# Patient Record
Sex: Female | Born: 1970 | Race: Black or African American | Hispanic: No | Marital: Married | Smoking: Never smoker
Health system: Southern US, Community
[De-identification: ages and names within clinical notes are randomized; demographics above are authoritative.]

---

## 2003-06-09 ENCOUNTER — Other Ambulatory Visit: Admission: RE | Admit: 2003-06-09 | Discharge: 2003-06-09 | Payer: Self-pay | Admitting: Gynecology

## 2005-04-30 ENCOUNTER — Other Ambulatory Visit: Admission: RE | Admit: 2005-04-30 | Discharge: 2005-04-30 | Payer: Self-pay | Admitting: Family Medicine

## 2006-07-28 ENCOUNTER — Encounter: Admission: RE | Admit: 2006-07-28 | Discharge: 2006-07-28 | Payer: Self-pay | Admitting: Family Medicine

## 2008-05-13 ENCOUNTER — Other Ambulatory Visit: Admission: RE | Admit: 2008-05-13 | Discharge: 2008-05-13 | Payer: Self-pay | Admitting: Family Medicine

## 2009-10-16 ENCOUNTER — Ambulatory Visit: Payer: Self-pay | Admitting: Gynecology

## 2009-10-16 ENCOUNTER — Other Ambulatory Visit: Admission: RE | Admit: 2009-10-16 | Discharge: 2009-10-16 | Payer: Self-pay | Admitting: Gynecology

## 2009-10-19 ENCOUNTER — Ambulatory Visit: Payer: Self-pay | Admitting: Gynecology

## 2010-01-15 ENCOUNTER — Ambulatory Visit (HOSPITAL_COMMUNITY): Admission: RE | Admit: 2010-01-15 | Discharge: 2010-01-15 | Payer: Self-pay | Admitting: Obstetrics and Gynecology

## 2010-02-06 ENCOUNTER — Ambulatory Visit (HOSPITAL_COMMUNITY): Admission: RE | Admit: 2010-02-06 | Discharge: 2010-02-06 | Payer: Self-pay | Admitting: Obstetrics and Gynecology

## 2010-02-27 ENCOUNTER — Ambulatory Visit (HOSPITAL_COMMUNITY): Admission: RE | Admit: 2010-02-27 | Discharge: 2010-02-27 | Payer: Self-pay | Admitting: Obstetrics and Gynecology

## 2010-05-27 ENCOUNTER — Inpatient Hospital Stay (HOSPITAL_COMMUNITY): Admission: AD | Admit: 2010-05-27 | Discharge: 2010-05-30 | Payer: Self-pay | Admitting: Obstetrics and Gynecology

## 2010-05-30 ENCOUNTER — Encounter: Admission: RE | Admit: 2010-05-30 | Discharge: 2010-06-29 | Payer: Self-pay | Admitting: Obstetrics and Gynecology

## 2010-06-30 ENCOUNTER — Encounter: Admission: RE | Admit: 2010-06-30 | Discharge: 2010-07-04 | Payer: Self-pay | Admitting: Obstetrics and Gynecology

## 2010-07-31 ENCOUNTER — Encounter: Admission: RE | Admit: 2010-07-31 | Discharge: 2010-08-30 | Payer: Self-pay | Admitting: Obstetrics and Gynecology

## 2010-08-31 ENCOUNTER — Encounter
Admission: RE | Admit: 2010-08-31 | Discharge: 2010-09-30 | Payer: Self-pay | Source: Home / Self Care | Attending: Obstetrics and Gynecology | Admitting: Obstetrics and Gynecology

## 2010-10-01 ENCOUNTER — Encounter
Admission: RE | Admit: 2010-10-01 | Discharge: 2010-10-31 | Payer: Self-pay | Source: Home / Self Care | Attending: Obstetrics and Gynecology | Admitting: Obstetrics and Gynecology

## 2010-11-01 ENCOUNTER — Encounter
Admission: RE | Admit: 2010-11-01 | Discharge: 2010-11-13 | Payer: Self-pay | Source: Home / Self Care | Attending: Obstetrics and Gynecology | Admitting: Obstetrics and Gynecology

## 2010-11-03 ENCOUNTER — Encounter: Payer: Self-pay | Admitting: Family Medicine

## 2010-11-04 ENCOUNTER — Encounter: Payer: Self-pay | Admitting: Obstetrics

## 2010-12-28 LAB — CBC
HCT: 29 % — ABNORMAL LOW (ref 36.0–46.0)
Hemoglobin: 10.1 g/dL — ABNORMAL LOW (ref 12.0–15.0)
MCH: 34.2 pg — ABNORMAL HIGH (ref 26.0–34.0)
MCH: 34 pg (ref 26.0–34.0)
MCHC: 34.7 g/dL (ref 30.0–36.0)
MCV: 98.3 fL (ref 78.0–100.0)
Platelets: 230 10*3/uL (ref 150–400)
RDW: 14 % (ref 11.5–15.5)

## 2011-07-25 IMAGING — US US OB DETAIL+14 WK
1 series · 18 of 28 positions shown · non-contrast
Comparison: none

OBSTETRICAL ULTRASOUND:
 This ultrasound was performed in The [HOSPITAL], and the [facility] report will be stored to [REDACTED] PACS.  This report is also available in [HOSPITAL]?s accessANYware.

[Series 1: us ob detail+14 wk · 18 of 114 slices shown]
[im 1/114]
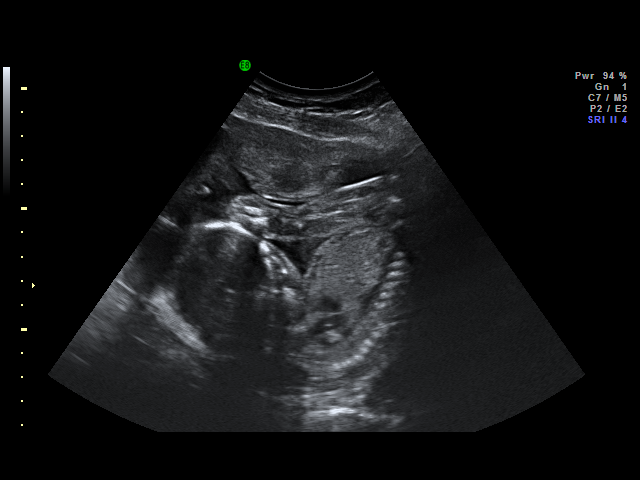
[im 9/114]
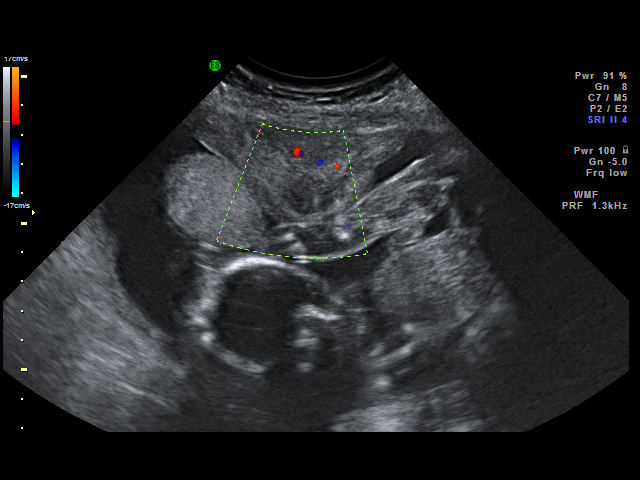
[im 13/114]
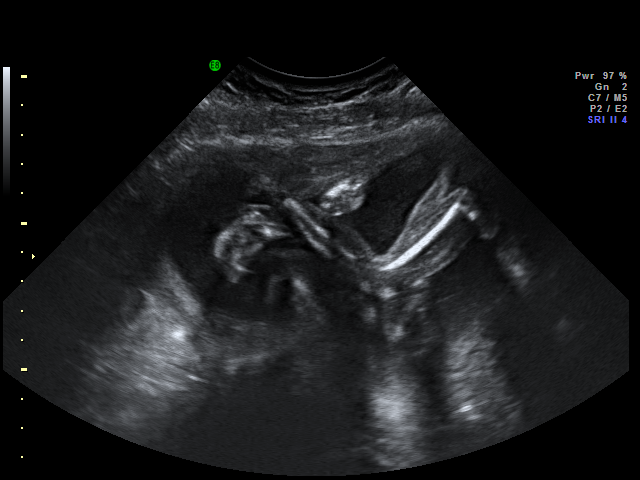
[im 21/114]
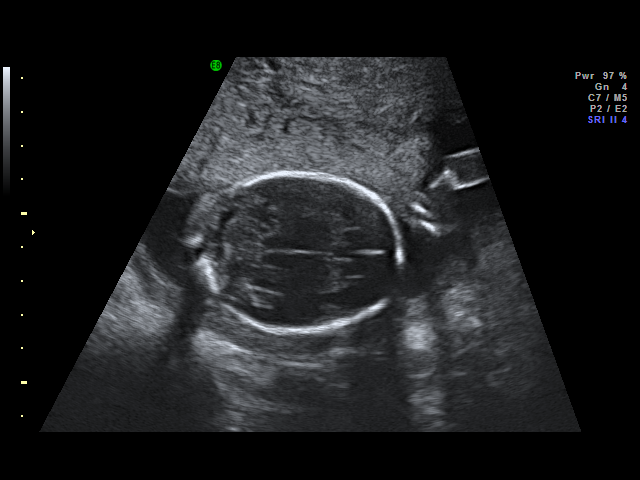
[im 30/114]
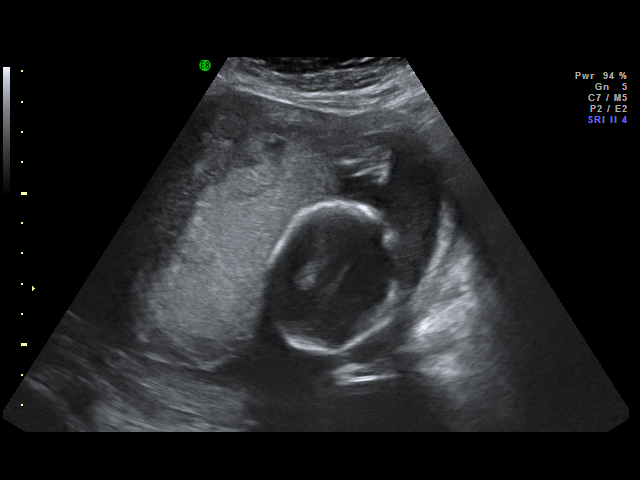
[im 34/114]
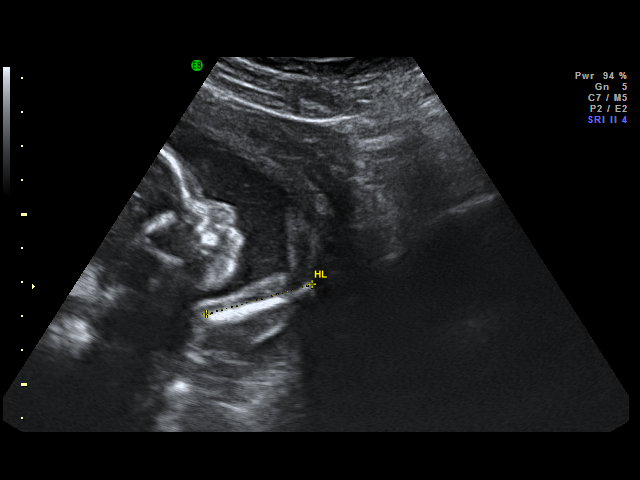
[im 42/114]
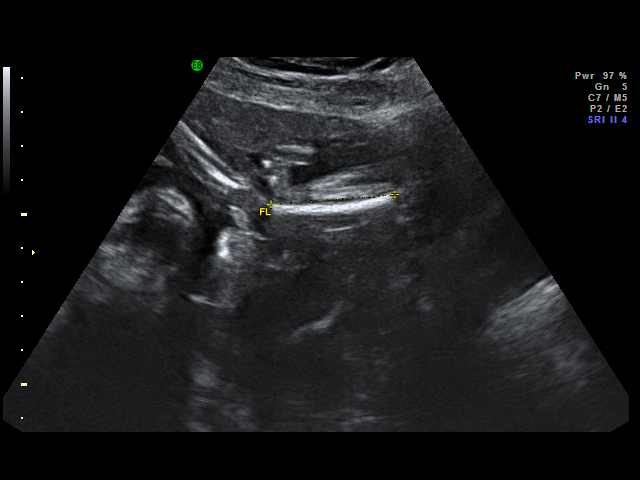
[im 47/114]
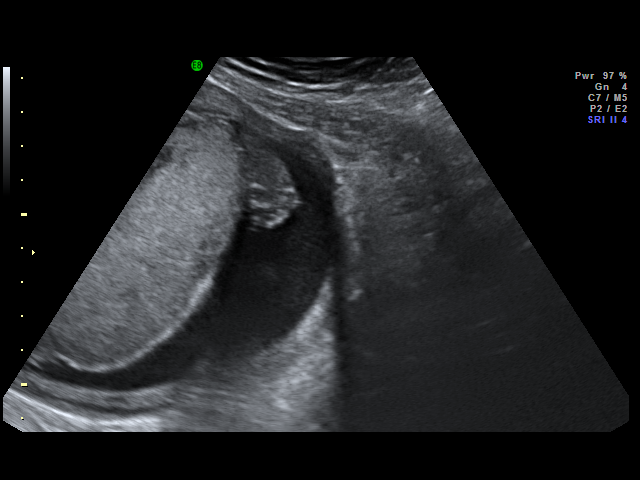
[im 55/114]
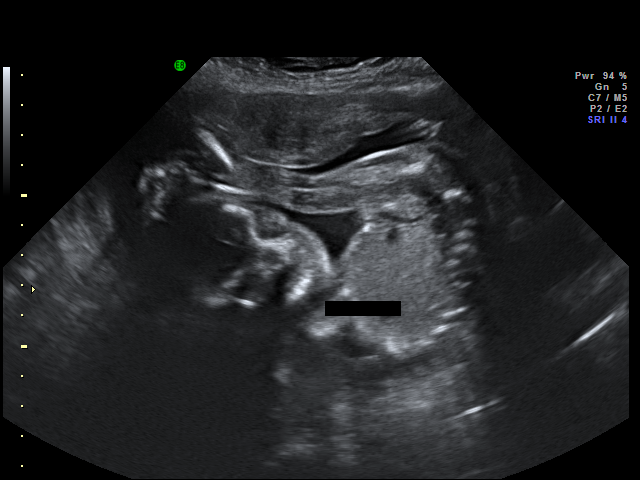
[im 59/114]
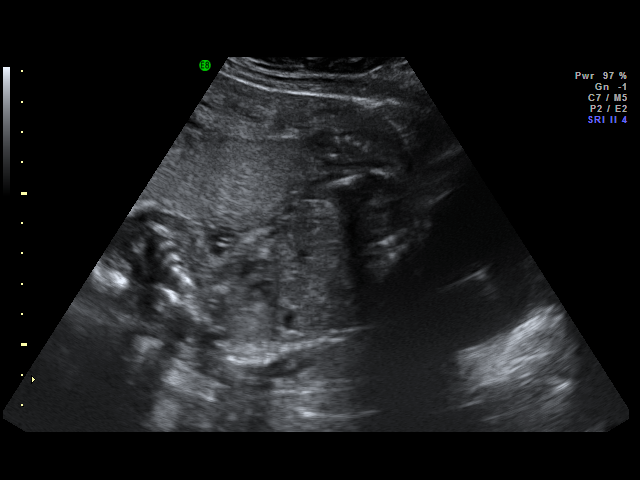
[im 67/114]
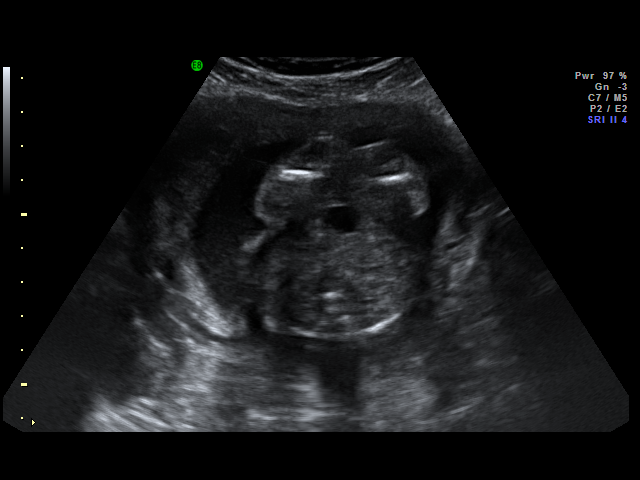
[im 72/114]
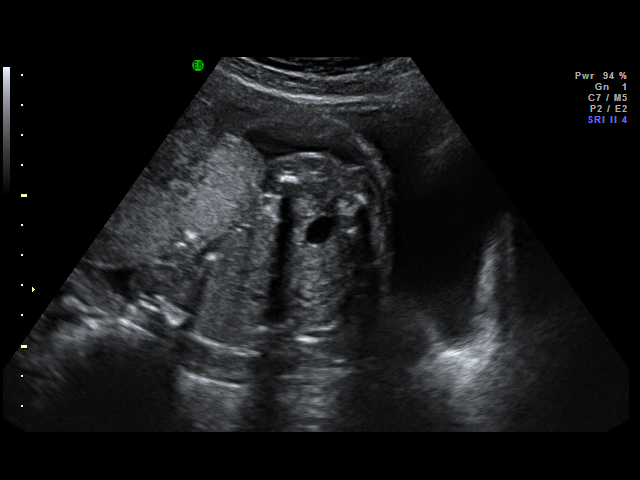
[im 80/114]
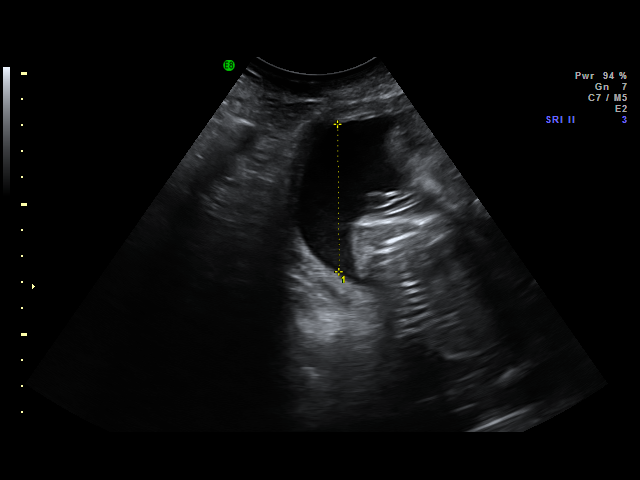
[im 88/114]
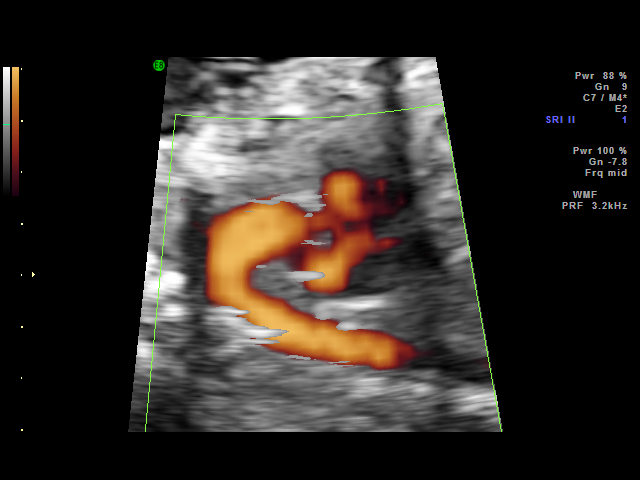
[im 93/114]
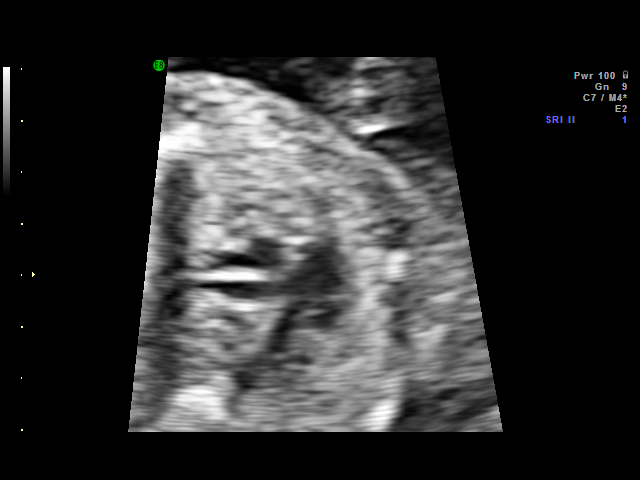
[im 101/114]
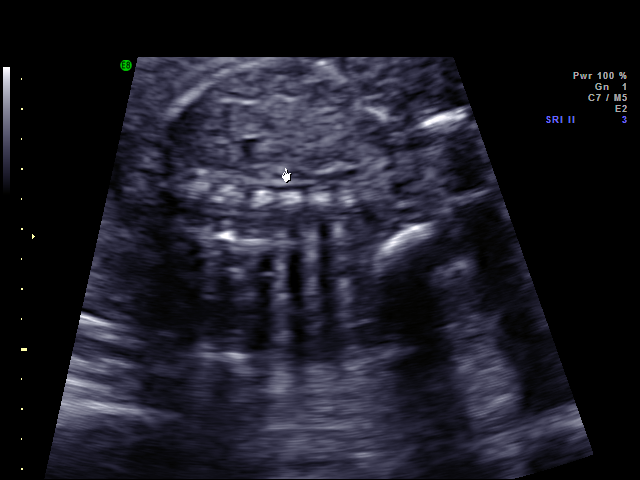
[im 105/114]
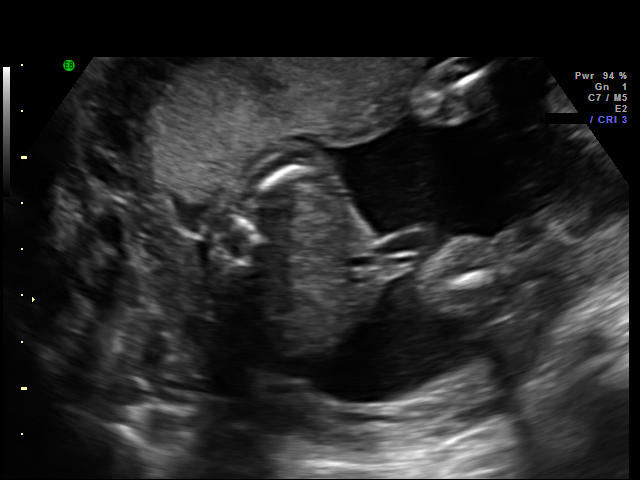
[im 114/114]
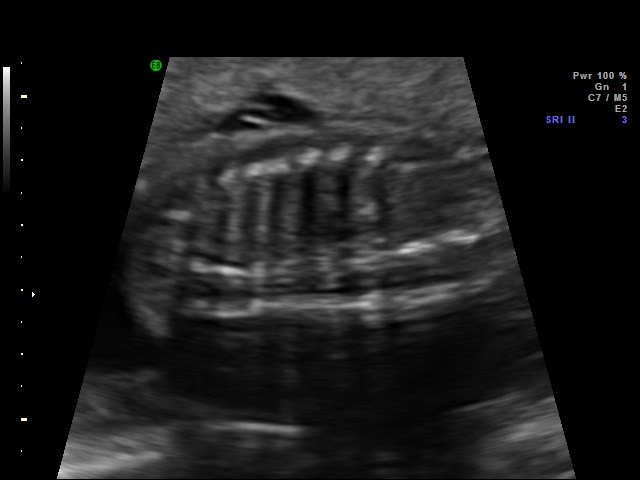

[18 of 28 positions shown; findings below may reference images not displayed]

IMPRESSION: [facility] has also been faxed to the ordering physician.

## 2011-09-06 IMAGING — US US OB FOLLOW-UP
1 series · 18 of 28 positions shown · non-contrast
Comparison: none

OBSTETRICAL ULTRASOUND:
 This ultrasound was performed in The [HOSPITAL], and the [facility] report will be stored to [REDACTED] PACS.  This report is also available in [HOSPITAL]?s accessANYware.

[Series 1: us ob follow-up · 29 acquisitions, 18 frames shown]
[im 1/29]
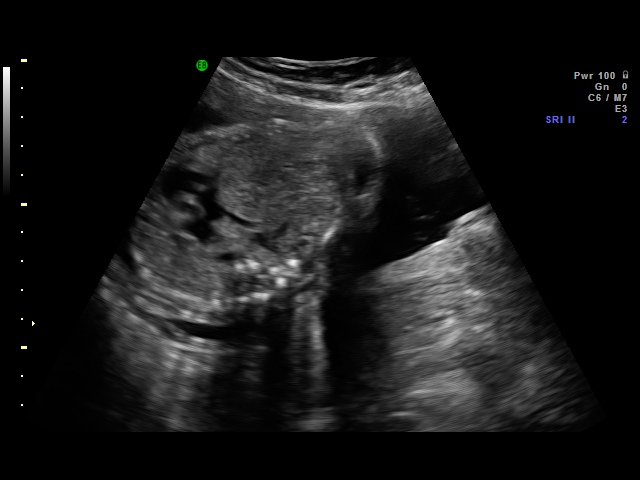
[im 3/29]
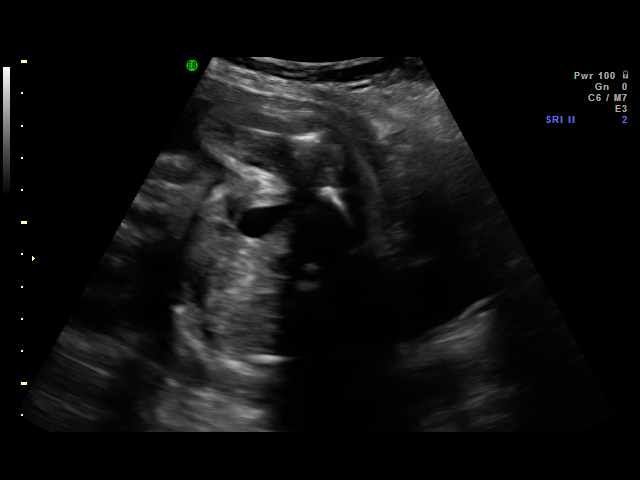
[im 4/29]
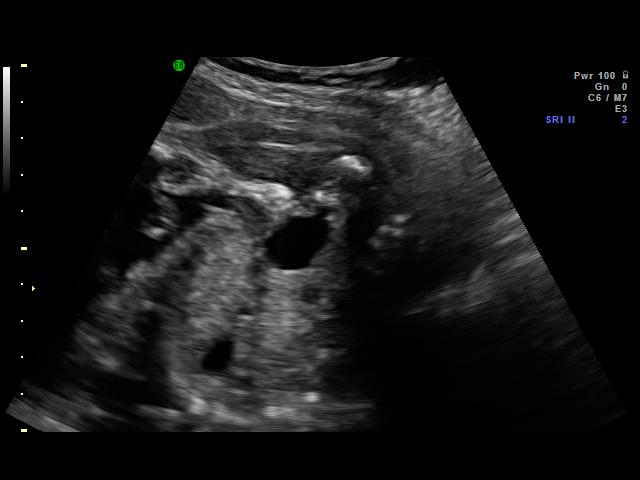
[im 6/29]
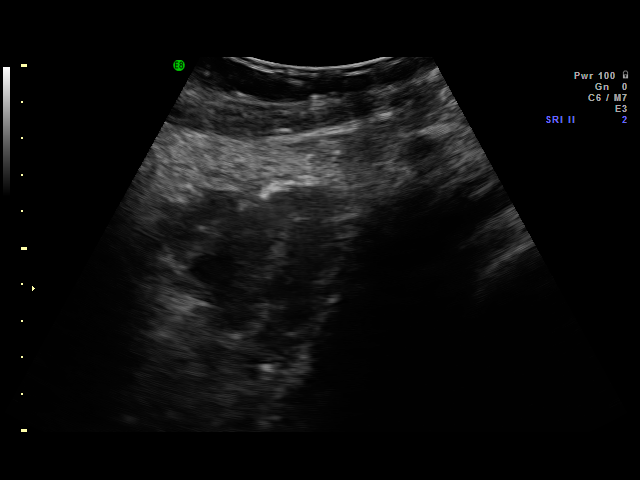
[im 8/29]
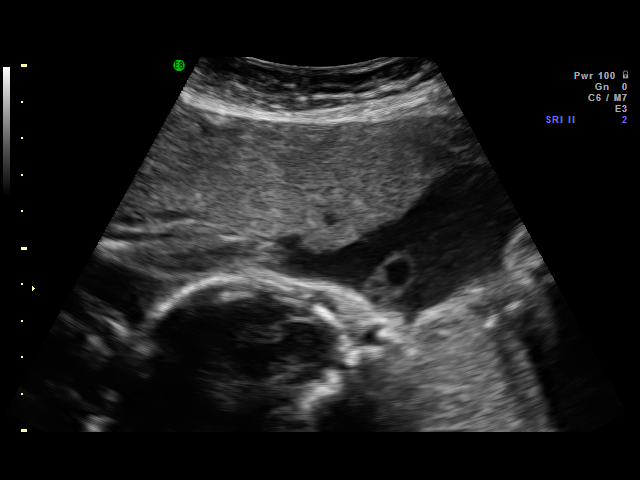
[im 9/29]
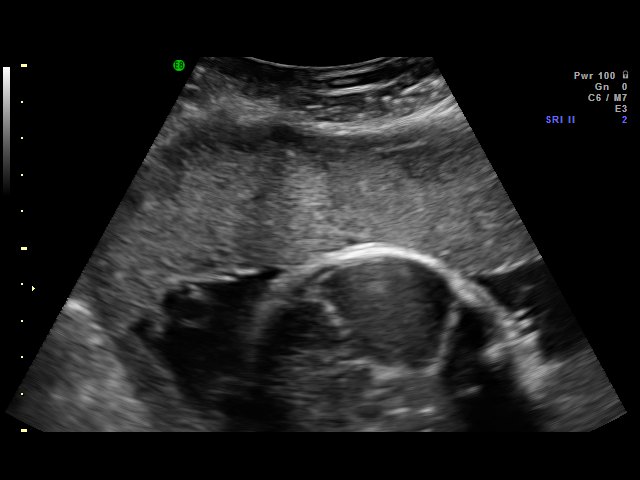
[im 11/29]
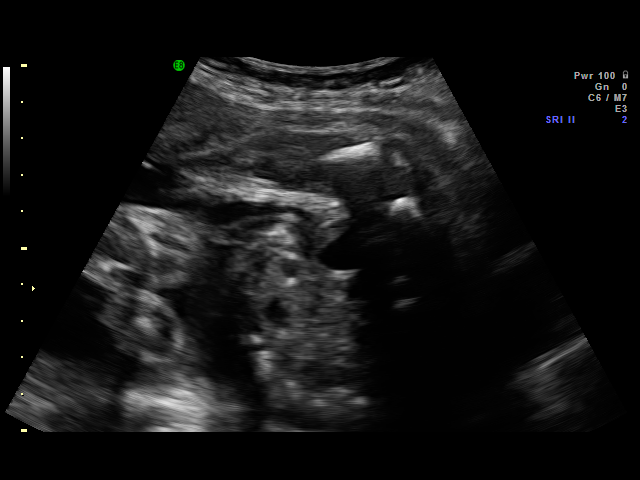
[im 12/29]
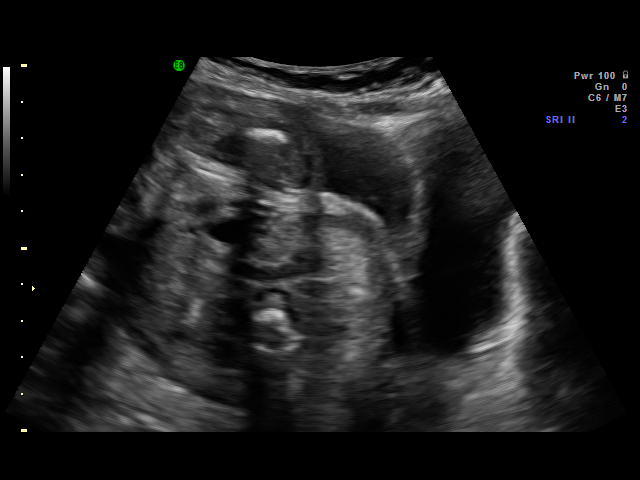
[im 14/29]
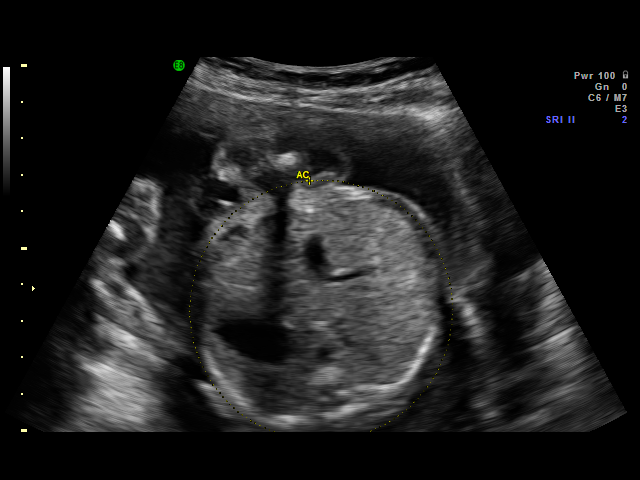
[im 15/29]
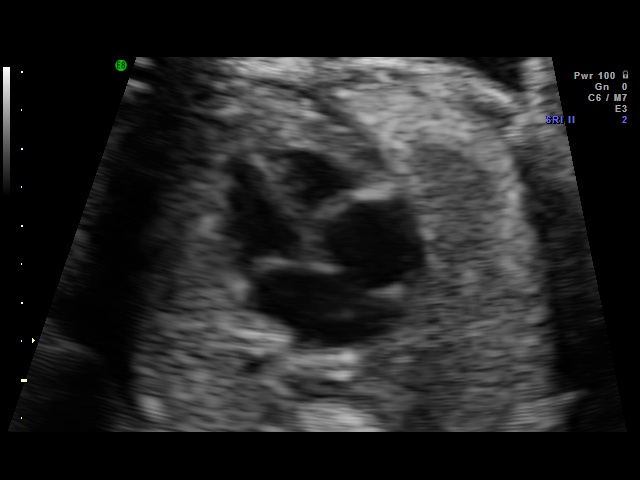
[im 17/29]
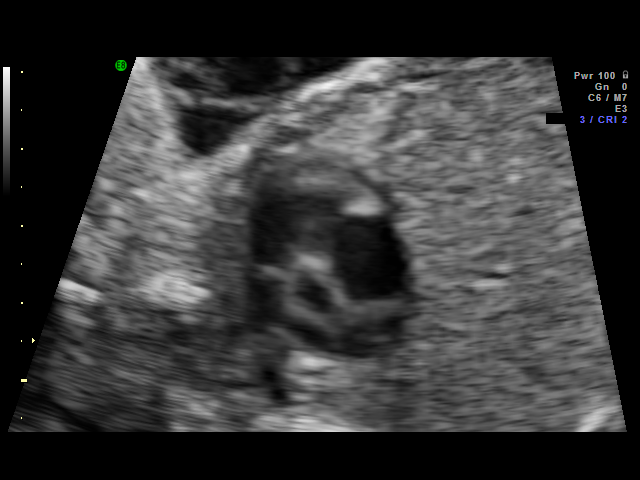
[im 18/29]
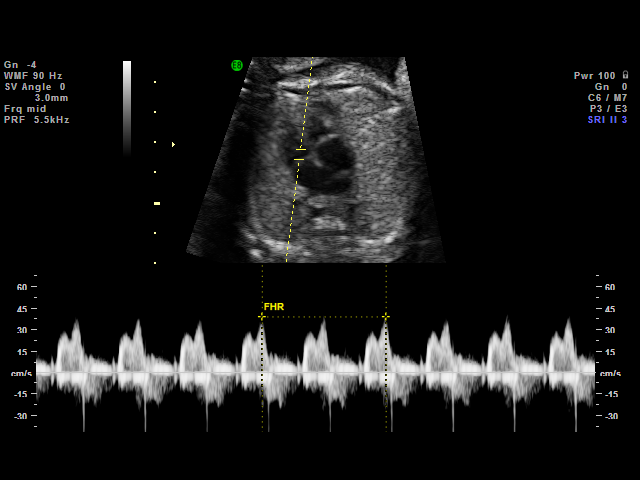
[im 20/29]
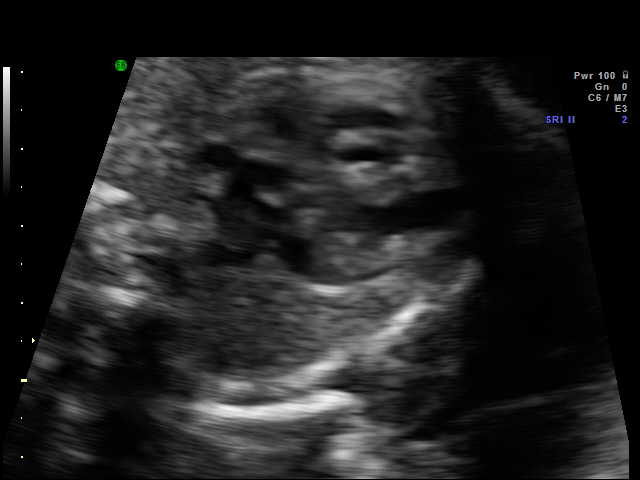
[im 22/29]
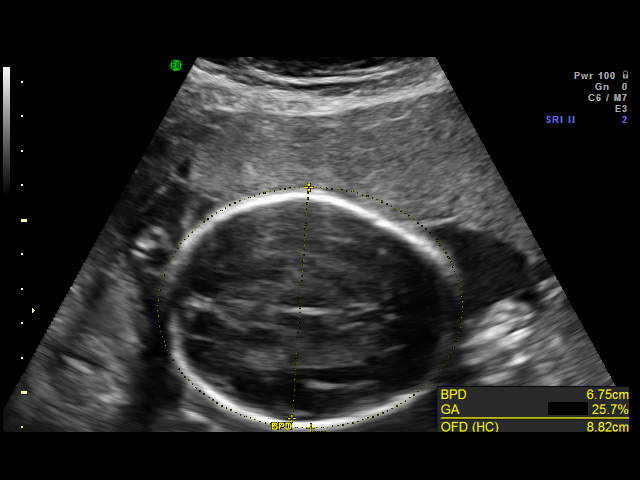
[im 23/29]
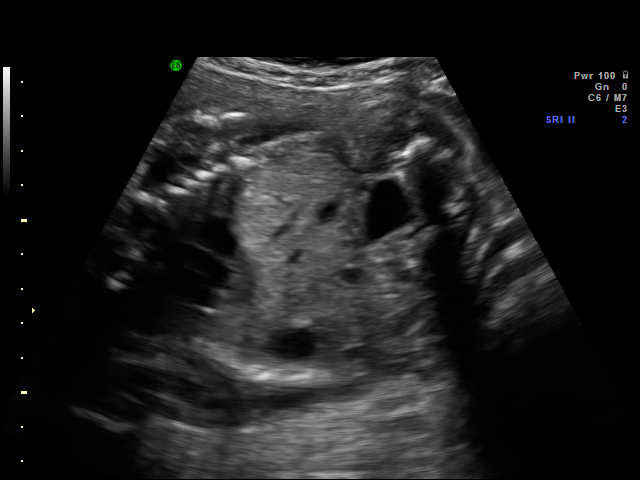
[im 25/29]
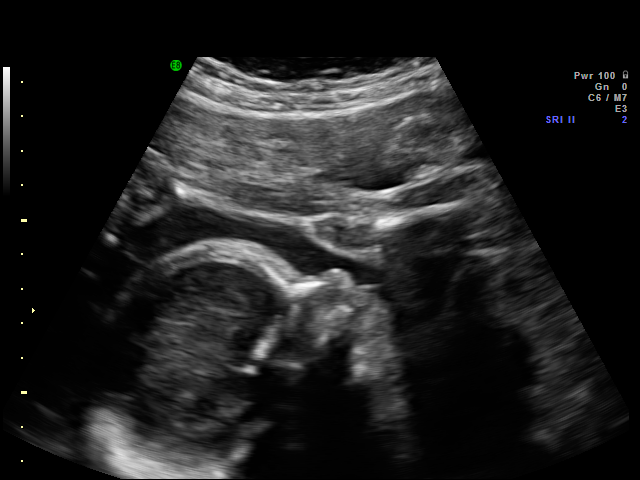
[im 26/29]
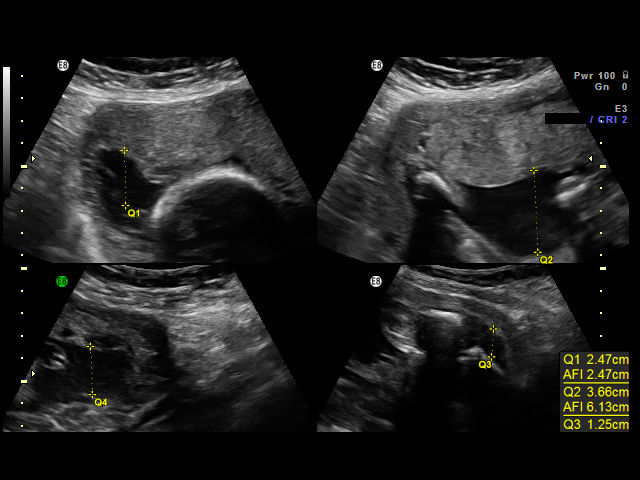
[im 29/29]
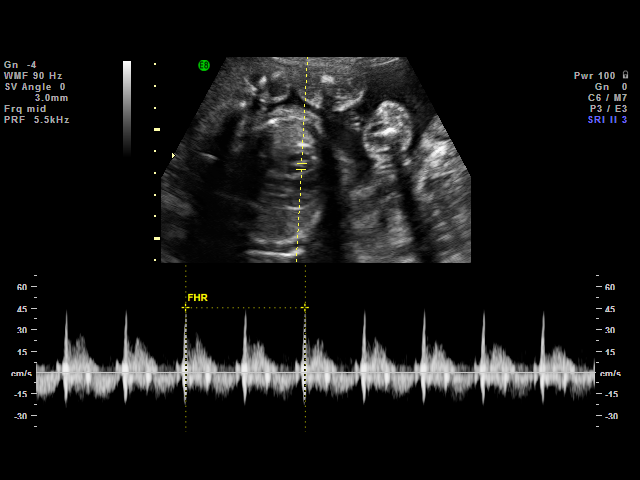

[18 of 28 positions shown; findings below may reference images not displayed]

IMPRESSION: [facility] has also been faxed to the ordering physician.

## 2014-08-31 ENCOUNTER — Encounter: Payer: Self-pay | Admitting: *Deleted

## 2015-06-15 ENCOUNTER — Other Ambulatory Visit: Payer: Self-pay

## 2015-06-16 LAB — CYTOLOGY - PAP

## 2018-01-20 DIAGNOSIS — Z809 Family history of malignant neoplasm, unspecified: Secondary | ICD-10-CM | POA: Diagnosis not present

## 2018-01-22 ENCOUNTER — Other Ambulatory Visit: Payer: Self-pay | Admitting: Obstetrics and Gynecology

## 2018-01-22 DIAGNOSIS — Z803 Family history of malignant neoplasm of breast: Secondary | ICD-10-CM

## 2018-01-31 ENCOUNTER — Ambulatory Visit
Admission: RE | Admit: 2018-01-31 | Discharge: 2018-01-31 | Disposition: A | Discharge status: Home / Self Care | Payer: Federal, State, Local not specified - PPO | Source: Ambulatory Visit | Attending: Obstetrics and Gynecology | Admitting: Obstetrics and Gynecology

## 2018-01-31 DIAGNOSIS — Z803 Family history of malignant neoplasm of breast: Secondary | ICD-10-CM

## 2018-01-31 MED ORDER — GADOBENATE DIMEGLUMINE 529 MG/ML IV SOLN
15.0000 mL | Freq: Once | INTRAVENOUS | Status: AC | PRN
  Administered 2018-01-31: 15 mL via INTRAVENOUS

## 2018-07-01 DIAGNOSIS — K080 Exfoliation of teeth due to systemic causes: Secondary | ICD-10-CM | POA: Diagnosis not present

## 2019-08-11 DIAGNOSIS — Z01419 Encounter for gynecological examination (general) (routine) without abnormal findings: Secondary | ICD-10-CM | POA: Diagnosis not present

## 2019-08-11 DIAGNOSIS — Z13220 Encounter for screening for lipoid disorders: Secondary | ICD-10-CM | POA: Diagnosis not present

## 2019-08-11 DIAGNOSIS — R5383 Other fatigue: Secondary | ICD-10-CM | POA: Diagnosis not present

## 2019-08-11 DIAGNOSIS — Z1329 Encounter for screening for other suspected endocrine disorder: Secondary | ICD-10-CM | POA: Diagnosis not present

## 2019-08-11 DIAGNOSIS — Z1151 Encounter for screening for human papillomavirus (HPV): Secondary | ICD-10-CM | POA: Diagnosis not present

## 2019-08-11 DIAGNOSIS — Z0000 Encounter for general adult medical examination without abnormal findings: Secondary | ICD-10-CM | POA: Diagnosis not present

## 2019-08-11 DIAGNOSIS — Z130 Encounter for screening for diseases of the blood and blood-forming organs and certain disorders involving the immune mechanism: Secondary | ICD-10-CM | POA: Diagnosis not present

## 2019-08-11 DIAGNOSIS — Z1231 Encounter for screening mammogram for malignant neoplasm of breast: Secondary | ICD-10-CM | POA: Diagnosis not present

## 2019-08-11 DIAGNOSIS — Z6830 Body mass index (BMI) 30.0-30.9, adult: Secondary | ICD-10-CM | POA: Diagnosis not present

## 2020-02-02 DIAGNOSIS — K080 Exfoliation of teeth due to systemic causes: Secondary | ICD-10-CM | POA: Diagnosis not present

## 2020-10-05 DIAGNOSIS — Z1329 Encounter for screening for other suspected endocrine disorder: Secondary | ICD-10-CM | POA: Diagnosis not present

## 2020-10-05 DIAGNOSIS — Z1231 Encounter for screening mammogram for malignant neoplasm of breast: Secondary | ICD-10-CM | POA: Diagnosis not present

## 2020-10-05 DIAGNOSIS — Z0000 Encounter for general adult medical examination without abnormal findings: Secondary | ICD-10-CM | POA: Diagnosis not present

## 2020-10-05 DIAGNOSIS — Z01419 Encounter for gynecological examination (general) (routine) without abnormal findings: Secondary | ICD-10-CM | POA: Diagnosis not present

## 2020-10-05 DIAGNOSIS — Z13220 Encounter for screening for lipoid disorders: Secondary | ICD-10-CM | POA: Diagnosis not present

## 2020-10-05 DIAGNOSIS — E559 Vitamin D deficiency, unspecified: Secondary | ICD-10-CM | POA: Diagnosis not present

## 2020-10-05 DIAGNOSIS — Z131 Encounter for screening for diabetes mellitus: Secondary | ICD-10-CM | POA: Diagnosis not present

## 2020-10-05 DIAGNOSIS — Z23 Encounter for immunization: Secondary | ICD-10-CM | POA: Diagnosis not present

## 2020-10-05 DIAGNOSIS — Z6830 Body mass index (BMI) 30.0-30.9, adult: Secondary | ICD-10-CM | POA: Diagnosis not present

## 2020-12-05 DIAGNOSIS — K5900 Constipation, unspecified: Secondary | ICD-10-CM | POA: Diagnosis not present

## 2020-12-05 DIAGNOSIS — Z1211 Encounter for screening for malignant neoplasm of colon: Secondary | ICD-10-CM | POA: Diagnosis not present

## 2021-02-09 DIAGNOSIS — Z1211 Encounter for screening for malignant neoplasm of colon: Secondary | ICD-10-CM | POA: Diagnosis not present

## 2021-11-22 ENCOUNTER — Telehealth: Payer: Self-pay

## 2021-11-22 DIAGNOSIS — Z13220 Encounter for screening for lipoid disorders: Secondary | ICD-10-CM | POA: Diagnosis not present

## 2021-11-22 DIAGNOSIS — Z1329 Encounter for screening for other suspected endocrine disorder: Secondary | ICD-10-CM | POA: Diagnosis not present

## 2021-11-22 DIAGNOSIS — Z131 Encounter for screening for diabetes mellitus: Secondary | ICD-10-CM | POA: Diagnosis not present

## 2021-11-22 DIAGNOSIS — Z01419 Encounter for gynecological examination (general) (routine) without abnormal findings: Secondary | ICD-10-CM | POA: Diagnosis not present

## 2021-11-22 DIAGNOSIS — Z1231 Encounter for screening mammogram for malignant neoplasm of breast: Secondary | ICD-10-CM | POA: Diagnosis not present

## 2021-11-22 DIAGNOSIS — Z0000 Encounter for general adult medical examination without abnormal findings: Secondary | ICD-10-CM | POA: Diagnosis not present

## 2021-11-22 DIAGNOSIS — Z6829 Body mass index (BMI) 29.0-29.9, adult: Secondary | ICD-10-CM | POA: Diagnosis not present

## 2021-11-22 NOTE — Telephone Encounter (Signed)
NOTES SCANNED TO REFERRAL 

## 2021-12-14 ENCOUNTER — Ambulatory Visit: Payer: Federal, State, Local not specified - PPO | Admitting: Internal Medicine

## 2021-12-14 ENCOUNTER — Encounter: Payer: Self-pay | Admitting: Internal Medicine

## 2021-12-14 ENCOUNTER — Other Ambulatory Visit: Payer: Self-pay

## 2021-12-14 VITALS — BP 116/72 | HR 61 | Ht 65.0 in | Wt 185.0 lb

## 2021-12-14 DIAGNOSIS — R55 Syncope and collapse: Secondary | ICD-10-CM

## 2021-12-14 NOTE — Patient Instructions (Signed)
Medication Instructions:  ?No Changes In Medications at this time.  ?*If you need a refill on your cardiac medications before your next appointment, please call your pharmacy* ? ?Follow-Up: ?At [facility], you and your health needs are our priority.  As part of our continuing mission to provide you with exceptional heart care, we have created designated Provider Care Teams.  These Care Teams include your primary Cardiologist (physician) and Advanced Practice Providers (APPs -  Physician Assistants and Nurse Practitioners) who all work together to provide you with the care you need, when you need it. ? ?Your next appointment:   ?AS NEEDED  ? ?The format for your next appointment:   ?In Person ? ?Provider:   ?Maisie Fus, MD   ? ?Other Instructions ?Your episode of fainting is consistent with vasovagal syncope. To help with this condition, you can ensure that you are well hydrated during the day. Identify any triggers that can contribute to feeling faint. If you feel you are going to pass out, you can slowly go to a seated position against the wall. If this recurs and your symptoms change, I can see you in follow up  ?

## 2021-12-14 NOTE — Progress Notes (Signed)
?Cardiology Office Note:   ? ?Date:  12/14/2021  ? ?ID:  Tammy Watts, DOB 11/25/1970, MRN [id number] ? ?PCP:  Patient, No Pcp Per (Inactive) ?  ?[facility] Providers ?Cardiologist:  Maisie Fus, MD    ? ?Referring MD: Shea Evans, MD  ? ?No chief complaint on file. ?Syncope ? ?History of Present Illness:   ? ?Tammy Watts is a 51 y.o. female with no significant pmhx ? ?She notes that she was working out. She reached for something to get something from the cabinet. She felt faint and then fell to the floor. She sensed that she was going to fall. She can feel dizzy with standing at times. She is going through menopause currently.  Not on hormone therapy. She fainted with giving blood before. No recurrent syncope. No stroke history ? ?She denies palpitations. No heart disease history. She has no hx of htn or DM2. No family hx of heart disease.  ? ?Current Medications: ?Current Meds  ?Medication Sig  ? Cholecalciferol 50 MCG (2000 UT) CAPS Vitamin D3 50 mcg (2,000 unit) capsule ?  2 capsules every day by oral route.  ? Multiple Vitamin (MULTIVITAMIN ADULT PO) Multivitamin Supplement  ?  ? ?Allergies:   Patient has no allergy information on record.  ? ?Social History  ? ?Socioeconomic History  ? Marital status: Married  ?  Spouse name: Not on file  ? Number of children: Not on file  ? Years of education: Not on file  ? Highest education level: Not on file  ?Occupational History  ? Not on file  ?Tobacco Use  ? Smoking status: Never  ? Smokeless tobacco: Not on file  ?Substance and Sexual Activity  ? Alcohol use: No  ? Drug use: No  ? Sexual activity: Not on file  ?Other Topics Concern  ? Not on file  ?Social History Narrative  ? Not on file  ? ?Social Determinants of Health  ? ?Financial Resource Strain: Not on file  ?Food Insecurity: Not on file  ?Transportation Needs: Not on file  ?Physical Activity: Not on file  ?Stress: Not on file  ?Social Connections: Not on file  ?  ? ?Family History: ?The  patient's per above ? ?ROS:   ?Please see the history of present illness.    ? All other systems reviewed and are negative. ? ?EKGs/Labs/Other Studies Reviewed:   ? ?The following studies were reviewed today: ? ? ?EKG:  EKG is  ordered today.  The ekg ordered today demonstrates  ? ?NSR, Qtc 396 ms ? ?Recent Labs: ?No results found for requested labs within last 8760 hours.  ?Recent Lipid Panel ?No results found for: CHOL, TRIG, HDL, CHOLHDL, VLDL, LDLCALC, LDLDIRECT ? ? ?Risk Assessment/Calculations:   ?  ? ?    ? ?Physical Exam:   ? ?VS: ? ?Vitals:  ? 12/14/21 1037  ?BP: 116/72  ?Pulse: 61  ?SpO2: 98%  ? ? ? ?  BP 116/72   Pulse 61   Ht 5\' 5"  (1.651 m)   Wt 185 lb (83.9 kg)   SpO2 98%   BMI 30.79 kg/m?    ? ?Wt Readings from Last 3 Encounters:  ?12/14/21 185 lb (83.9 kg)  ?  ? ?GEN:  Well nourished, well developed in no acute distress ?HEENT: Normal ?NECK: No JVD; No carotid bruits ?LYMPHATICS: No lymphadenopathy ?CARDIAC: RRR, no murmurs, rubs, gallops ?RESPIRATORY:  Clear to auscultation without rales, wheezing or rhonchi  ?ABDOMEN:non-distended ?MUSCULOSKELETAL:  No edema; No  deformity  ?SKIN: Warm and dry ?NEUROLOGIC:  Alert and oriented x 3 ?PSYCHIATRIC:  Normal affect  ? ?ASSESSMENT:   ? ?#Vasovagal syncope: She does not have high risk features including syncope c/f arrhythmia , family hx of SCD, or abnormalities on her EKG. Her symptoms are consistent with vasovagal syncope. We discussed identifying triggers and staying hydrated. ? ? ?PLAN:   ? ?In order of problems listed above: ? ?Follow up as needed ? ?   ? ?   ? ? ?Medication Adjustments/Labs and Tests Ordered: ?Current medicines are reviewed at length with the patient today.  Concerns regarding medicines are outlined above.  ?Orders Placed This Encounter  ?Procedures  ? EKG 12-Lead  ? ?No orders of the defined types were placed in this encounter. ? ? ?Patient Instructions  ?Medication Instructions:  ?No Changes In Medications at this time.  ?*If  you need a refill on your cardiac medications before your next appointment, please call your pharmacy* ? ?Follow-Up: ?At [facility], you and your health needs are our priority.  As part of our continuing mission to provide you with exceptional heart care, we have created designated Provider Care Teams.  These Care Teams include your primary Cardiologist (physician) and Advanced Practice Providers (APPs -  Physician Assistants and Nurse Practitioners) who all work together to provide you with the care you need, when you need it. ? ?Your next appointment:   ?AS NEEDED  ? ?The format for your next appointment:   ?In Person ? ?Provider:   ?Maisie Fus, MD   ? ?Other Instructions ?Your episode of fainting is consistent with vasovagal syncope. To help with this condition, you can ensure that you are well hydrated during the day. Identify any triggers that can contribute to feeling faint. If you feel you are going to pass out, you can slowly go to a seated position against the wall. If this recurs and your symptoms change, I can see you in follow up   ? ?Signed, ?Maisie Fus, MD  ?12/14/2021 11:40 AM    ? [facility] ?

## 2022-11-28 DIAGNOSIS — Z01419 Encounter for gynecological examination (general) (routine) without abnormal findings: Secondary | ICD-10-CM | POA: Diagnosis not present

## 2022-11-28 DIAGNOSIS — Z124 Encounter for screening for malignant neoplasm of cervix: Secondary | ICD-10-CM | POA: Diagnosis not present

## 2022-11-28 DIAGNOSIS — Z1329 Encounter for screening for other suspected endocrine disorder: Secondary | ICD-10-CM | POA: Diagnosis not present

## 2022-11-28 DIAGNOSIS — Z13220 Encounter for screening for lipoid disorders: Secondary | ICD-10-CM | POA: Diagnosis not present

## 2022-11-28 DIAGNOSIS — Z1231 Encounter for screening mammogram for malignant neoplasm of breast: Secondary | ICD-10-CM | POA: Diagnosis not present

## 2022-11-28 DIAGNOSIS — Z131 Encounter for screening for diabetes mellitus: Secondary | ICD-10-CM | POA: Diagnosis not present

## 2022-11-28 DIAGNOSIS — Z6829 Body mass index (BMI) 29.0-29.9, adult: Secondary | ICD-10-CM | POA: Diagnosis not present

## 2022-11-28 DIAGNOSIS — Z0000 Encounter for general adult medical examination without abnormal findings: Secondary | ICD-10-CM | POA: Diagnosis not present

## 2023-11-22 LAB — AMB RESULTS CONSOLE CBG: Glucose: 90

## 2023-12-02 DIAGNOSIS — R7303 Prediabetes: Secondary | ICD-10-CM | POA: Diagnosis not present

## 2023-12-02 DIAGNOSIS — Z1231 Encounter for screening mammogram for malignant neoplasm of breast: Secondary | ICD-10-CM | POA: Diagnosis not present

## 2023-12-02 DIAGNOSIS — Z0000 Encounter for general adult medical examination without abnormal findings: Secondary | ICD-10-CM | POA: Diagnosis not present

## 2023-12-02 DIAGNOSIS — Z01419 Encounter for gynecological examination (general) (routine) without abnormal findings: Secondary | ICD-10-CM | POA: Diagnosis not present

## 2023-12-02 DIAGNOSIS — Z13220 Encounter for screening for lipoid disorders: Secondary | ICD-10-CM | POA: Diagnosis not present

## 2023-12-02 DIAGNOSIS — Z1329 Encounter for screening for other suspected endocrine disorder: Secondary | ICD-10-CM | POA: Diagnosis not present

## 2023-12-02 DIAGNOSIS — Z1331 Encounter for screening for depression: Secondary | ICD-10-CM | POA: Diagnosis not present

## 2024-01-13 NOTE — Progress Notes (Unsigned)
 The patient attended a screening event on 11/22/2023, where her blood pressure was measured at ______ mmHg, and her A1C was _____%, placing her in the diabetic range. During the event, the patient reported that he/she does/does not smoke, has insurance, is/is'n't established with a primary care provider (PCP), and does/does not have identified social determinants of health (SDOH) concerns. A chart review confirmed that the patient has/does not have an active PCP.  The patient's most recent office visit was on ______, where she was referred by her PCP for _____.  ________ is appropriately listed in her medical record, and a follow-up appointment with her PCP is scheduled for _______.  The patient demonstrates consistent engagement in her care. At this time, no additional support from the [facility] Team is indicated./An additional follow up will be done at a later date per the [facility] Teams protocol.

## 2024-04-30 NOTE — Progress Notes (Signed)
 Pt attended 11/22/2023 screening event where her BP was 129/77 and her blood sugar was 90. At the event pt did not note any SDOH needs and documented that she is not a smoker. Pt documented that she does have insurance and she has a current PCP. Chart review indicated that she is actively seeing Tammy Watts, her last appt was 12/02/2023. [facility] contacted pt via phone and pt stated that she received get care now flyer but has not selected a new PCP. She is currently using Tammy. Barbette as her PCP and at this time does not wish to establish care with another Tammy. [name] did not have any additional SDOH needs at this time. Per [facility] protocol no additional follow up indicated at this time.

## 2024-11-17 ENCOUNTER — Encounter: Payer: Self-pay | Admitting: Cardiology

## 2024-11-17 ENCOUNTER — Ambulatory Visit: Admitting: Cardiology

## 2024-11-17 ENCOUNTER — Encounter: Payer: Self-pay | Admitting: *Deleted

## 2024-11-17 VITALS — BP 130/80 | HR 66 | Ht 66.0 in | Wt 177.0 lb

## 2024-11-17 DIAGNOSIS — R55 Syncope and collapse: Secondary | ICD-10-CM

## 2024-11-17 DIAGNOSIS — Z7689 Persons encountering health services in other specified circumstances: Secondary | ICD-10-CM

## 2024-11-17 DIAGNOSIS — Z13220 Encounter for screening for lipoid disorders: Secondary | ICD-10-CM

## 2024-11-17 DIAGNOSIS — Z131 Encounter for screening for diabetes mellitus: Secondary | ICD-10-CM

## 2024-11-17 DIAGNOSIS — Z79899 Other long term (current) drug therapy: Secondary | ICD-10-CM

## 2024-11-17 LAB — LIPID PANEL

## 2024-11-17 NOTE — Progress Notes (Signed)
 Patient enrolled for [employer]/ [employer] to ship a 30 day cardiac event monitor to the patients address on file.

## 2024-11-17 NOTE — Patient Instructions (Signed)
 Medication Instructions:  Your physician recommends that you continue on your current medications as directed. Please refer to the Current Medication list given to you today.  *If you need a refill on your cardiac medications before your next appointment, please call your pharmacy*  Lab Work: CMET, Mag, Lipids, Lp(a), HgbA1c If you have labs (blood work) drawn today and your tests are completely normal, you will receive your results only by: MyChart Message (if you have MyChart) OR A paper copy in the mail If you have any lab test that is abnormal or we need to change your treatment, we will call you to review the results.  Testing/Procedures: Your physician has requested that you have an echocardiogram. Echocardiography is a painless test that uses sound waves to create images of your heart. It provides your doctor with information about the size and shape of your heart and how well your hearts chambers and valves are working. This procedure takes approximately one hour. There are no restrictions for this procedure. Please do NOT wear cologne, perfume, aftershave, or lotions (deodorant is allowed). Please arrive 15 minutes prior to your appointment time.  Please note: We ask at that you not bring children with you during ultrasound (echo/ vascular) testing. Due to room size and safety concerns, children are not allowed in the ultrasound rooms during exams. Our front office staff cannot provide observation of children in our lobby area while testing is being conducted. An adult accompanying a patient to their appointment will only be allowed in the ultrasound room at the discretion of the ultrasound technician under special circumstances. We apologize for any inconvenience.  [employer] Cardiac Event Monitor Instructions  Your physician has requested you wear your cardiac event monitor for _____ days, (1-30). [employer] may call or text to confirm a shipping address. The monitor will be sent to  a land address via UPS. [employer] will not ship a monitor to a PO BOX. It typically takes 3-5 days to receive your monitor after it has been enrolled. [employer] will assist with USPS tracking if your package is delayed. The telephone number for [employer] is [phone]. Once you have received your monitor, please review the enclosed instructions. Instruction tutorials can also be viewed under help and settings on the enclosed cell phone. Your monitor has already been registered assigning a specific monitor serial # to you.  Billing and Self Pay Discount Information  [employer] has been provided the insurance information we had on file for you.  If your insurance has been updated, please call [employer] at [phone] to provide them with your updated insurance information.   [employer] offers a discounted Self Pay option for patients who have insurance that does not cover their cardiac event monitor or patients without insurance.  The discounted cost of a Self Pay Cardiac Event Monitor would be $225.00 , if the patient contacts [employer] at [phone] within 7 days of applying the monitor to make payment arrangements.  If the patient does not contact [employer] within 7 days of applying the monitor, the cost of the cardiac event monitor will be $350.00.  Applying the monitor  Remove cell phone from case and turn it on. The cell phone works as it consultant and needs to be within [employer] of you at all times. The cell phone will need to be charged on a daily basis. We recommend you plug the cell phone into the enclosed charger at your bedside table every night.  Monitor batteries: You will receive two monitor batteries labelled #  1 and #2. These are your recorders. Plug battery #2 onto the second connection on the enclosed charger. Keep one battery on the charger at all times. This will keep the monitor battery deactivated. It will also keep it fully charged for when you need to  switch your monitor batteries. A small light will be blinking on the battery emblem when it is charging. The light on the battery emblem will remain on when the battery is fully charged.  Open package of a Monitor strip. Insert battery #1 into black hood on strip and gently squeeze monitor battery onto connection as indicated in instruction booklet. Set aside while preparing skin.  Choose location for your strip, vertical or horizontal, as indicated in the instruction booklet. Shave to remove all hair from location. There cannot be any lotions, oils, powders, or colognes on skin where monitor is to be applied. Wipe skin clean with enclosed Saline wipe. Dry skin completely.  Peel paper labeled #1 off the back of the Monitor strip exposing the adhesive. Place the monitor on the chest in the vertical or horizontal position shown in the instruction booklet. One arrow on the monitor strip must be pointing upward. Carefully remove paper labeled #2, attaching remainder of strip to your skin. Try not to create any folds or wrinkles in the strip as you apply it.  Firmly press and release the circle in the center of the monitor battery. You will hear a small beep. This is turning the monitor battery on. The heart emblem on the monitor battery will light up every 5 seconds if the monitor battery in turned on and connected to the patient securely. Do not push and hold the circle down as this turns the monitor battery off. The cell phone will locate the monitor battery. A screen will appear on the cell phone checking the connection of your monitor strip. This may read poor connection initially but change to good connection within the next minute. Once your monitor accepts the connection you will hear a series of 3 beeps followed by a climbing crescendo of beeps. A screen will appear on the cell phone showing the two monitor strip placement options. Touch the picture that demonstrates where you applied the  monitor strip.  Your monitor strip and battery are waterproof. You are able to shower, bathe, or swim with the monitor on. They just ask you do not submerge deeper than 3 feet underwater. We recommend removing the monitor if you are swimming in a lake, river, or ocean.  Your monitor battery will need to be switched to a fully charged monitor battery approximately once a week. The cell phone will alert you of an action which needs to be made.  On the cell phone, tap for details to reveal connection status, monitor battery status, and cell phone battery status. The green dots indicates your monitor is in good status. A red dot indicates there is something that needs your attention.  To record a symptom, click the circle on the monitor battery. In 30-60 seconds a list of symptoms will appear on the cell phone. Select your symptom and tap save. Your monitor will record a sustained or significant arrhythmia regardless of you clicking the button. Some patients do not feel the heart rhythm irregularities. [employer] will notify us  of any serious or critical events.  Refer to instruction booklet for instructions on switching batteries, changing strips, the Do not disturb or Pause features, or any additional questions.  Call [employer] at [phone], to confirm  your monitor is transmitting and record your baseline. They will answer any questions you may have regarding the monitor instructions at that time.  Returning the monitor to [employer]  Place all equipment back into blue box. Peel off strip of paper to expose adhesive and close box securely. There is a prepaid UPS shipping label on this box. Drop in a UPS drop box, or at a UPS facility like [employer]. You may also contact [employer] to arrange UPS to pick up monitor package at your home.   Follow-Up: At [facility], you and your health needs are our priority.  As part of our continuing mission to provide you with exceptional  heart care, our providers are all part of one team.  This team includes your primary Cardiologist (physician) and Advanced Practice Providers or APPs (Physician Assistants and Nurse Practitioners) who all work together to provide you with the care you need, when you need it.  Your next appointment:   12 week(s)  Provider:   Kardie Tobb, DO    Other Instructions:

## 2024-11-18 LAB — COMPREHENSIVE METABOLIC PANEL WITH GFR
ALT: 13 [IU]/L (ref 0–32)
AST: 15 [IU]/L (ref 0–40)
Albumin: 4.3 g/dL (ref 3.8–4.9)
Alkaline Phosphatase: 83 [IU]/L (ref 49–135)
BUN/Creatinine Ratio: 19 (ref 9–23)
BUN: 18 mg/dL (ref 6–24)
Bilirubin Total: 0.4 mg/dL (ref 0.0–1.2)
CO2: 22 mmol/L (ref 20–29)
Calcium: 9.5 mg/dL (ref 8.7–10.2)
Chloride: 104 mmol/L (ref 96–106)
Creatinine, Ser: 0.94 mg/dL (ref 0.57–1.00)
Globulin, Total: 2.8 g/dL (ref 1.5–4.5)
Glucose: 101 mg/dL — AB (ref 70–99)
Potassium: 4 mmol/L (ref 3.5–5.2)
Sodium: 141 mmol/L (ref 134–144)
Total Protein: 7.1 g/dL (ref 6.0–8.5)
eGFR: 73 mL/min/{1.73_m2}

## 2024-11-18 LAB — LIPOPROTEIN A (LPA)

## 2024-11-18 LAB — LIPID PANEL
Cholesterol, Total: 172 mg/dL (ref 100–199)
HDL: 53 mg/dL
LDL CALC COMMENT:: 3.2 ratio (ref 0.0–4.4)
LDL Chol Calc (NIH): 107 mg/dL — AB (ref 0–99)
Triglycerides: 61 mg/dL (ref 0–149)
VLDL Cholesterol Cal: 12 mg/dL (ref 5–40)

## 2024-11-18 LAB — MAGNESIUM: Magnesium: 2 mg/dL (ref 1.6–2.3)

## 2024-11-18 LAB — HEMOGLOBIN A1C
Est. average glucose Bld gHb Est-mCnc: 120 mg/dL
Hgb A1c MFr Bld: 5.8 % — ABNORMAL HIGH (ref 4.8–5.6)

## 2024-12-29 ENCOUNTER — Ambulatory Visit (HOSPITAL_COMMUNITY)
